# Patient Record
Sex: Male | Born: 1995 | Race: Black or African American | Hispanic: No | Marital: Married | Smoking: Current every day smoker
Health system: Southern US, Community
[De-identification: ages and names within clinical notes are randomized; demographics above are authoritative.]

## PROBLEM LIST (undated history)

## (undated) HISTORY — PX: ANKLE SURGERY: SHX546

## (undated) HISTORY — PX: FOOT SURGERY: SHX648

---
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
## 2014-01-19 ENCOUNTER — Emergency Department (HOSPITAL_COMMUNITY)
Admission: EM | Admit: 2014-01-19 | Discharge: 2014-01-20 | Disposition: A | Discharge status: Home / Self Care | Payer: Self-pay | Attending: Emergency Medicine | Admitting: Emergency Medicine

## 2014-01-19 ENCOUNTER — Encounter (HOSPITAL_COMMUNITY): Payer: Self-pay | Admitting: Emergency Medicine

## 2014-01-19 ENCOUNTER — Emergency Department (HOSPITAL_COMMUNITY): Payer: Self-pay

## 2014-01-19 DIAGNOSIS — N451 Epididymitis: Secondary | ICD-10-CM | POA: Insufficient documentation

## 2014-01-19 DIAGNOSIS — R52 Pain, unspecified: Secondary | ICD-10-CM

## 2014-01-19 DIAGNOSIS — Z791 Long term (current) use of non-steroidal anti-inflammatories (NSAID): Secondary | ICD-10-CM | POA: Insufficient documentation

## 2014-01-19 DIAGNOSIS — Z792 Long term (current) use of antibiotics: Secondary | ICD-10-CM | POA: Insufficient documentation

## 2014-01-19 DIAGNOSIS — N50819 Testicular pain, unspecified: Secondary | ICD-10-CM

## 2014-01-19 LAB — COMPREHENSIVE METABOLIC PANEL
ALBUMIN: 4.3 g/dL (ref 3.5–5.2)
ALT: 10 U/L (ref 0–53)
AST: 21 U/L (ref 0–37)
Alkaline Phosphatase: 73 U/L (ref 39–117)
Anion gap: 14 (ref 5–15)
BUN: 10 mg/dL (ref 6–23)
CALCIUM: 9.9 mg/dL (ref 8.4–10.5)
CHLORIDE: 103 meq/L (ref 96–112)
CO2: 25 mEq/L (ref 19–32)
Creatinine, Ser: 0.73 mg/dL (ref 0.50–1.35)
GFR calc Af Amer: >90 mL/min (ref >90)
Glucose, Bld: 98 mg/dL (ref 70–99)
Potassium: 4.4 mEq/L (ref 3.7–5.3)
SODIUM: 142 meq/L (ref 137–147)
Total Bilirubin: 0.3 mg/dL (ref 0.3–1.2)
Total Protein: 8.2 g/dL (ref 6.0–8.3)

## 2014-01-19 LAB — CBC WITH DIFFERENTIAL/PLATELET
Basophils Absolute: 0 10*3/uL (ref 0.0–0.1)
Basophils Relative: 0 % (ref 0–1)
EOS ABS: 0.1 10*3/uL (ref 0.0–0.7)
EOS PCT: 1 % (ref 0–5)
HCT: 41.7 % (ref 39.0–52.0)
Hemoglobin: 14.5 g/dL (ref 13.0–17.0)
LYMPHS ABS: 3.4 10*3/uL (ref 0.7–4.0)
Lymphocytes Relative: 27 % (ref 12–46)
MCH: 29.2 pg (ref 26.0–34.0)
MCHC: 34.8 g/dL (ref 30.0–36.0)
MCV: 83.9 fL (ref 78.0–100.0)
MONO ABS: 1.1 10*3/uL — AB (ref 0.1–1.0)
MONOS PCT: 9 % (ref 3–12)
Neutro Abs: 8 10*3/uL — ABNORMAL HIGH (ref 1.7–7.7)
Neutrophils Relative %: 63 % (ref 43–77)
Platelets: 259 10*3/uL (ref 150–400)
RBC: 4.97 MIL/uL (ref 4.22–5.81)
RDW: 13.6 % (ref 11.5–15.5)
WBC: 12.6 10*3/uL — ABNORMAL HIGH (ref 4.0–10.5)

## 2014-01-19 NOTE — ED Notes (Signed)
Pt. reports right testicle pain/swelling onset 2 days ago , denies injury , no dysuria , no fever or chills.

## 2014-01-20 ENCOUNTER — Emergency Department (HOSPITAL_COMMUNITY): Payer: Self-pay

## 2014-01-20 LAB — URINALYSIS, ROUTINE W REFLEX MICROSCOPIC
BILIRUBIN URINE: NEGATIVE
GLUCOSE, UA: NEGATIVE mg/dL
Hgb urine dipstick: NEGATIVE
KETONES UR: 15 mg/dL — AB
Nitrite: NEGATIVE
PROTEIN: 30 mg/dL — AB
Specific Gravity, Urine: 1.03 (ref 1.005–1.030)
UROBILINOGEN UA: 1 mg/dL (ref 0.0–1.0)
pH: 6.5 (ref 5.0–8.0)

## 2014-01-20 LAB — URINE MICROSCOPIC-ADD ON

## 2014-01-20 MED ORDER — DEXTROSE 5 % IV SOLN
1.0000 g | Freq: Once | INTRAVENOUS | Status: AC
  Administered 2014-01-20: 1 g via INTRAVENOUS
  Filled 2014-01-20: qty 10

## 2014-01-20 MED ORDER — DOXYCYCLINE HYCLATE 100 MG PO CAPS: 100.0000 mg | ORAL_CAPSULE | Freq: Two times a day (BID) | ORAL | Status: AC

## 2014-01-20 MED ORDER — DOXYCYCLINE HYCLATE 100 MG PO TABS
100.0000 mg | ORAL_TABLET | Freq: Once | ORAL | Status: AC
  Administered 2014-01-20: 100 mg via ORAL
  Filled 2014-01-20: qty 1

## 2014-01-20 MED ORDER — HYDROCODONE-ACETAMINOPHEN 5-325 MG PO TABS
1.0000 | ORAL_TABLET | ORAL | Status: AC
  Administered 2014-01-20: 1 via ORAL
  Filled 2014-01-20: qty 1

## 2014-01-20 MED ORDER — HYDROCODONE-ACETAMINOPHEN 5-325 MG PO TABS: 1.0000 | ORAL_TABLET | ORAL | Status: AC | PRN

## 2014-01-20 NOTE — ED Provider Notes (Signed)
CSN: [id number]     Arrival date & time 01/19/14  2212 History   First MD Initiated Contact with Patient 01/20/14 0001     This chart was scribed for Linwood DibblesJon Kemberly Taves, MD by Arlan OrganAshley Leger, ED Scribe. This patient was seen in room A13C/A13C and the patient's care was started 3:27 AM.;   Chief Complaint  Patient presents with  . Testicle Pain    HPI  HPI Comments: Donald Wallace is a 18 y.o. male who presents to the Emergency Department complaining of sudden onset, constant R sided testicular pain that radiates down the R thigh x 2 days. Pain is exacerbated when bending over without any alleviating factors. Pt also reports mild swelling to the testicle. No recent injury or trauma. He has tried OTC Ibuprofen without any improvement for symptoms. No history of previous similar pain. Pt denies any fever, chills, SOB, CP, or abdominal pain. No known allergies to medications.   History reviewed. No pertinent past medical history. Past Surgical History  Procedure Laterality Date  . Ankle surgery    . Foot surgery     No family history on file. History  Substance Use Topics  . Smoking status: Never Smoker   . Smokeless tobacco: Not on file  . Alcohol Use: No    Review of Systems  Constitutional: Negative for fever and chills.  Respiratory: Negative for shortness of breath.   Cardiovascular: Negative for chest pain.  Gastrointestinal: Negative for nausea and vomiting.  Genitourinary: Positive for testicular pain.  All other systems reviewed and are negative.     Allergies  Review of patient's allergies indicates no known allergies.  Home Medications   Prior to Admission medications   Medication Sig Start Date End Date Taking? Authorizing Provider  clindamycin (CLEOCIN) 300 MG capsule Take 1 capsule (300 mg total) by mouth 3 (three) times daily. 11/21/12   Linna HoffJames D Kindl, MD  diclofenac (CATAFLAM) 50 MG tablet Take 1 tablet (50 mg total) by mouth 3 (three) times daily. 11/21/12   Linna HoffJames D  Kindl, MD  doxycycline (VIBRAMYCIN) 100 MG capsule Take 1 capsule (100 mg total) by mouth 2 (two) times daily. 01/20/14   Linwood DibblesJon Zaquan Duffner, MD  HYDROcodone-acetaminophen (NORCO/VICODIN) 5-325 MG per tablet Take 1-2 tablets by mouth every 4 (four) hours as needed. 01/20/14   Linwood DibblesJon Pookela Sellin, MD  ibuprofen (ADVIL,MOTRIN) 600 MG tablet Take 1 tablet (600 mg total) by mouth every 8 (eight) hours as needed for pain. 11/21/12   Simmie DaviesAngela M Muller, NP   Triage Vitals: BP 137/75 mmHg  Pulse 65  Temp(Src) 98.1 F (36.7 C) (Oral)  Resp 16  Ht 5\' 7"  (1.702 m)  Wt 143 lb (64.864 kg)  BMI 22.39 kg/m2  SpO2 99%   Physical Exam  Constitutional: He appears well-developed and well-nourished. No distress.  HENT:  Head: Normocephalic and atraumatic.  Right Ear: External ear normal.  Left Ear: External ear normal.  Eyes: Conjunctivae are normal. Right eye exhibits no discharge. Left eye exhibits no discharge. No scleral icterus.  Neck: Neck supple. No tracheal deviation present.  Cardiovascular: Normal rate, regular rhythm and intact distal pulses.   Pulmonary/Chest: Effort normal and breath sounds normal. No stridor. No respiratory distress. He has no wheezes. He has no rales.  Abdominal: Soft. Bowel sounds are normal. He exhibits no distension. There is no tenderness. There is no rebound and no guarding. Hernia confirmed negative in the right inguinal area and confirmed negative in the left inguinal area.  Genitourinary: Right  testis shows swelling and tenderness. Left testis shows no swelling and no tenderness.  Musculoskeletal: He exhibits no edema or tenderness.  Neurological: He is alert. He has normal strength. No cranial nerve deficit (no facial droop, extraocular movements intact, no slurred speech) or sensory deficit. He exhibits normal muscle tone. He displays no seizure activity. Coordination normal.  Skin: Skin is warm and dry. No rash noted.  Psychiatric: He has a normal mood and affect.  Nursing note and  vitals reviewed.   ED Course  Procedures (including critical care time)  DIAGNOSTIC STUDIES: Oxygen Saturation is 100% on RA, Normal by my interpretation.    COORDINATION OF CARE: 3:27 AM- Will order blood work, imaging, and urinalysis. Discussed treatment plan with pt at bedside and pt agreed to plan.     Labs Review Labs Reviewed  CBC WITH DIFFERENTIAL - Abnormal; Notable for the following:    WBC 12.6 (*)    Neutro Abs 8.0 (*)    Monocytes Absolute 1.1 (*)    All other components within normal limits  URINALYSIS, ROUTINE W REFLEX MICROSCOPIC - Abnormal; Notable for the following:    Color, Urine AMBER (*)    APPearance CLOUDY (*)    Ketones, ur 15 (*)    Protein, ur 30 (*)    Leukocytes, UA MODERATE (*)    All other components within normal limits  URINE MICROSCOPIC-ADD ON - Abnormal; Notable for the following:    Squamous Epithelial / LPF FEW (*)    Bacteria, UA FEW (*)    All other components within normal limits  COMPREHENSIVE METABOLIC PANEL    Imaging Review [place] Scrotum  01/20/2014   CLINICAL DATA:  Acute right testicular pain.  EXAM: SCROTAL ULTRASOUND  DOPPLER ULTRASOUND OF THE TESTICLES  TECHNIQUE: Complete ultrasound examination of the testicles, epididymis, and other scrotal structures was performed. Color and spectral Doppler ultrasound were also utilized to evaluate blood flow to the testicles.  COMPARISON:  None.  FINDINGS: Right testicle  Measurements: 3.9 x 3.6 x 2.5 cm. Increased flow is noted on Doppler. No mass or microlithiasis visualized.  Left testicle  Measurements: 4.6 x 3.0 x 2.1 cm. No mass or microlithiasis visualized.  Right epididymis: Right epididymis is significantly enlarged with increased flow seen on Doppler.  Left epididymis:  Normal in size and appearance.  Hydrocele:  None visualized.  Varicocele:  Mild bilateral varicoceles are noted.  Pulsed Doppler interrogation of both testes demonstrates low resistance arterial and venous waveforms  bilaterally.  IMPRESSION: Findings consistent with right epididymitis with possible associated right orchitis.   Electronically Signed   By: Roque LiasJames  Green M.D.   On: 01/20/2014 01:59   [place] Art/ven Flow Abd Pelv Doppler  01/20/2014   CLINICAL DATA:  Acute right testicular pain.  EXAM: SCROTAL ULTRASOUND  DOPPLER ULTRASOUND OF THE TESTICLES  TECHNIQUE: Complete ultrasound examination of the testicles, epididymis, and other scrotal structures was performed. Color and spectral Doppler ultrasound were also utilized to evaluate blood flow to the testicles.  COMPARISON:  None.  FINDINGS: Right testicle  Measurements: 3.9 x 3.6 x 2.5 cm. Increased flow is noted on Doppler. No mass or microlithiasis visualized.  Left testicle  Measurements: 4.6 x 3.0 x 2.1 cm. No mass or microlithiasis visualized.  Right epididymis: Right epididymis is significantly enlarged with increased flow seen on Doppler.  Left epididymis:  Normal in size and appearance.  Hydrocele:  None visualized.  Varicocele:  Mild bilateral varicoceles are noted.  Pulsed Doppler interrogation of both  testes demonstrates low resistance arterial and venous waveforms bilaterally.  IMPRESSION: Findings consistent with right epididymitis with possible associated right orchitis.   Electronically Signed   By: Roque Lias M.D.   On: 01/20/2014 01:59      MDM   Final diagnoses:  Epididymitis    Patient's ultrasound is consistent with his exam findings. He has an epididymitis and possible right orchitis. No evidence of testicular torsion. The patient will be treated with a gram of Rocephin IV in the emergency department and a prescription for doxycycline. Follow up with urology if not improving.  I personally performed the services described in this documentation, which was scribed in my presence. The recorded information has been reviewed and is accurate.    Linwood Dibbles, MD 01/20/14 [phone]

## 2014-01-20 NOTE — Discharge Instructions (Signed)
Epididymitis °Epididymitis is a swelling (inflammation) of the epididymis. The epididymis is a cord-like structure along the back part of the testicle. Epididymitis is usually, but not always, caused by infection. This is usually a sudden problem beginning with chills, fever and pain behind the scrotum and in the testicle. There may be swelling and redness of the testicle. °DIAGNOSIS  °Physical examination will reveal a tender, swollen epididymis. Sometimes, cultures are obtained from the urine or from prostate secretions to help find out if there is an infection or if the cause is a different problem. Sometimes, blood work is performed to see if your white blood cell count is elevated and if a germ (bacterial) or viral infection is present. Using this knowledge, an appropriate medicine which kills germs (antibiotic) can be chosen by your caregiver. A viral infection causing epididymitis will most often go away (resolve) without treatment. °HOME CARE INSTRUCTIONS  °· Hot sitz baths for 20 minutes, 4 times per day, may help relieve pain. °· Only take over-the-counter or prescription medicines for pain, discomfort or fever as directed by your caregiver. °· Take all medicines, including antibiotics, as directed. Take the antibiotics for the full prescribed length of time even if you are feeling better. °· It is very important to keep all follow-up appointments. °SEEK IMMEDIATE MEDICAL CARE IF:  °· You have a fever. °· You have pain not relieved with medicines. °· You have any worsening of your problems. °· Your pain seems to come and go. °· You develop pain, redness, and swelling in the scrotum and surrounding areas. °MAKE SURE YOU:  °· Understand these instructions. °· Will watch your condition. °· Will get help right away if you are not doing well or get worse. °Document Released: 01/21/2000 Document Revised: 04/17/2011 Document Reviewed: 12/10/2008 °ExitCare® Patient Information ©2015 [employer]. This information  is not intended to replace advice given to you by your health care provider. Make sure you discuss any questions you have with your health care provider. ° °

## 2014-10-12 ENCOUNTER — Encounter (HOSPITAL_COMMUNITY): Payer: Self-pay | Admitting: Emergency Medicine

## 2014-10-12 ENCOUNTER — Emergency Department (HOSPITAL_COMMUNITY)
Admission: EM | Admit: 2014-10-12 | Discharge: 2014-10-13 | Disposition: A | Discharge status: Home / Self Care | Payer: Self-pay | Attending: Emergency Medicine | Admitting: Emergency Medicine

## 2014-10-12 DIAGNOSIS — Z791 Long term (current) use of non-steroidal anti-inflammatories (NSAID): Secondary | ICD-10-CM | POA: Insufficient documentation

## 2014-10-12 DIAGNOSIS — Z792 Long term (current) use of antibiotics: Secondary | ICD-10-CM | POA: Insufficient documentation

## 2014-10-12 DIAGNOSIS — J029 Acute pharyngitis, unspecified: Secondary | ICD-10-CM | POA: Insufficient documentation

## 2014-10-12 LAB — RAPID STREP SCREEN (MED CTR MEBANE ONLY): Streptococcus, Group A Screen (Direct): NEGATIVE

## 2014-10-12 MED ORDER — IBUPROFEN 200 MG PO TABS
600.0000 mg | ORAL_TABLET | Freq: Once | ORAL | Status: AC
  Administered 2014-10-13: 600 mg via ORAL
  Filled 2014-10-12: qty 3

## 2014-10-12 NOTE — ED Notes (Signed)
Pt reports difficulty swallowing and "I can't really breathe" starting today. Denies being sick recently. When asked if throat was sore says, "I don't really know." RR even/unlabored. No other c/c.

## 2014-10-12 NOTE — ED Provider Notes (Signed)
CSN: [id number]     Arrival date & time 10/12/14  2321 History   First MD Initiated Contact with Patient 10/12/14 2331     Chief Complaint  Patient presents with  . Sore Throat  . Nasal Congestion     (Consider location/radiation/quality/duration/timing/severity/associated sxs/prior Treatment) Patient is a 19 y.o. male presenting with pharyngitis. The history is provided by the patient. No language interpreter was used.  Sore Throat This is a new problem. The current episode started today. Associated symptoms include a sore throat. Pertinent negatives include no chills or fever. Associated symptoms comments: Sore throat since this morning. No fever. He has not taken anything for symptoms. He reports it hurts to swallow and also that his neck is sore in the front and around to the back. No cough or significant congestion.Marland Kitchen    History reviewed. No pertinent past medical history. Past Surgical History  Procedure Laterality Date  . Ankle surgery    . Foot surgery     History reviewed. No pertinent family history. Social History  Substance Use Topics  . Smoking status: Never Smoker   . Smokeless tobacco: None  . Alcohol Use: No    Review of Systems  Constitutional: Negative for fever and chills.  HENT: Positive for sore throat. Negative for trouble swallowing.   Respiratory: Negative.   Cardiovascular: Negative.   Gastrointestinal: Negative.   Musculoskeletal: Negative.   Skin: Negative.   Neurological: Negative.       Allergies  Review of patient's allergies indicates no known allergies.  Home Medications   Prior to Admission medications   Medication Sig Start Date End Date Taking? Authorizing Provider  clindamycin (CLEOCIN) 300 MG capsule Take 1 capsule (300 mg total) by mouth 3 (three) times daily. 11/21/12   Linna Hoff, MD  diclofenac (CATAFLAM) 50 MG tablet Take 1 tablet (50 mg total) by mouth 3 (three) times daily. 11/21/12   Linna Hoff, MD  doxycycline  (VIBRAMYCIN) 100 MG capsule Take 1 capsule (100 mg total) by mouth 2 (two) times daily. 01/20/14   Linwood Dibbles, MD  HYDROcodone-acetaminophen (NORCO/VICODIN) 5-325 MG per tablet Take 1-2 tablets by mouth every 4 (four) hours as needed. 01/20/14   Linwood Dibbles, MD  ibuprofen (ADVIL,MOTRIN) 600 MG tablet Take 1 tablet (600 mg total) by mouth every 8 (eight) hours as needed for pain. 11/21/12   Isaias Sakai, NP   BP 132/77 mmHg  Pulse 71  Temp(Src) 97.9 F (36.6 C) (Oral)  Resp 17  SpO2 97% Physical Exam  Constitutional: He is oriented to person, place, and time. He appears well-developed and well-nourished. No distress.  HENT:  Oropharynx slightly erythematous without swelling or exudates. Uvula midline.   Neck: Normal range of motion. Neck supple.  Cardiovascular: Normal rate.   Pulmonary/Chest: Effort normal.  Abdominal: Soft. There is no tenderness.  Musculoskeletal: Normal range of motion.  Lymphadenopathy:    He has cervical adenopathy.  Neurological: He is alert and oriented to person, place, and time.  Skin: Skin is warm and dry.  Psychiatric: He has a normal mood and affect.    ED Course  Procedures (including critical care time) Labs Review Labs Reviewed  RAPID STREP SCREEN (NOT AT [facility])  CULTURE, GROUP A STREP    Imaging Review No results found. I have personally reviewed and evaluated these images and lab results as part of my medical decision-making.   EKG Interpretation None      MDM   Final diagnoses:  None  1. Pharyngitis  Patient is well appearing with normal VS. Negative strep. Suspect viral process requiring supportive care.     Elpidio Anis, PA-C 10/13/14 4098  Tomasita Crumble, MD 10/13/14 [phone]

## 2014-10-13 MED ORDER — IBUPROFEN 600 MG PO TABS: 600.0000 mg | ORAL_TABLET | Freq: Four times a day (QID) | ORAL | Status: DC | PRN

## 2014-10-13 NOTE — Discharge Instructions (Signed)
Salt Water Gargle °This solution will help make your mouth and throat feel better. °HOME CARE INSTRUCTIONS  °· Mix 1 teaspoon of salt in 8 ounces of warm water. °· Gargle with this solution as much or often as you need or as directed. Swish and gargle gently if you have any sores or wounds in your mouth. °· Do not swallow this mixture. °Document Released: 10/28/2003 Document Revised: 04/17/2011 Document Reviewed: 03/20/2008 °ExitCare® Patient Information ©2015 [employer]. This information is not intended to replace advice given to you by your health care provider. Make sure you discuss any questions you have with your health care provider. °Pharyngitis °Pharyngitis is redness, pain, and swelling (inflammation) of your pharynx.  °CAUSES  °Pharyngitis is usually caused by infection. Most of the time, these infections are from viruses (viral) and are part of a cold. However, sometimes pharyngitis is caused by bacteria (bacterial). Pharyngitis can also be caused by allergies. Viral pharyngitis may be spread from person to person by coughing, sneezing, and personal items or utensils (cups, forks, spoons, toothbrushes). Bacterial pharyngitis may be spread from person to person by more intimate contact, such as kissing.  °SIGNS AND SYMPTOMS  °Symptoms of pharyngitis include:   °· Sore throat.   °· Tiredness (fatigue).   °· Low-grade fever.   °· Headache. °· Joint pain and muscle aches. °· Skin rashes. °· Swollen lymph nodes. °· Plaque-like film on throat or tonsils (often seen with bacterial pharyngitis). °DIAGNOSIS  °Your health care provider will ask you questions about your illness and your symptoms. Your medical history, along with a physical exam, is often all that is needed to diagnose pharyngitis. Sometimes, a rapid strep test is done. Other lab tests may also be done, depending on the suspected cause.  °TREATMENT  °Viral pharyngitis will usually get better in 3-4 days without the use of medicine. Bacterial  pharyngitis is treated with medicines that kill germs (antibiotics).  °HOME CARE INSTRUCTIONS  °· Drink enough water and fluids to keep your urine clear or pale yellow.   °· Only take over-the-counter or prescription medicines as directed by your health care provider:   °¨ If you are prescribed antibiotics, make sure you finish them even if you start to feel better.   °¨ Do not take aspirin.   °· Get lots of rest.   °· Gargle with 8 oz of salt water (½ tsp of salt per 1 qt of water) as often as every 1-2 hours to soothe your throat.   °· Throat lozenges (if you are not at risk for choking) or sprays may be used to soothe your throat. °SEEK MEDICAL CARE IF:  °· You have large, tender lumps in your neck. °· You have a rash. °· You cough up green, yellow-brown, or bloody spit. °SEEK IMMEDIATE MEDICAL CARE IF:  °· Your neck becomes stiff. °· You drool or are unable to swallow liquids. °· You vomit or are unable to keep medicines or liquids down. °· You have severe pain that does not go away with the use of recommended medicines. °· You have trouble breathing (not caused by a stuffy nose). °MAKE SURE YOU:  °· Understand these instructions. °· Will watch your condition. °· Will get help right away if you are not doing well or get worse. °Document Released: 01/23/2005 Document Revised: 11/13/2012 Document Reviewed: 09/30/2012 °ExitCare® Patient Information ©2015 [employer]. This information is not intended to replace advice given to you by your health care provider. Make sure you discuss any questions you have with your health care provider. ° °

## 2014-10-15 LAB — CULTURE, GROUP A STREP: Strep A Culture: NEGATIVE

## 2015-10-16 ENCOUNTER — Emergency Department (HOSPITAL_COMMUNITY): Admission: EM | Admit: 2015-10-16 | Discharge: 2015-10-17 | Discharge status: Against Medical Advice | Payer: Self-pay

## 2015-10-16 NOTE — ED Notes (Signed)
Pt called multiple times with no answer.

## 2016-07-26 ENCOUNTER — Emergency Department (HOSPITAL_COMMUNITY)
Admission: EM | Admit: 2016-07-26 | Discharge: 2016-07-26 | Disposition: A | Discharge status: Home / Self Care | Payer: Self-pay | Attending: Emergency Medicine | Admitting: Emergency Medicine

## 2016-07-26 ENCOUNTER — Encounter (HOSPITAL_COMMUNITY): Payer: Self-pay

## 2016-07-26 ENCOUNTER — Ambulatory Visit: Payer: Self-pay | Admitting: Internal Medicine

## 2016-07-26 DIAGNOSIS — F17200 Nicotine dependence, unspecified, uncomplicated: Secondary | ICD-10-CM | POA: Insufficient documentation

## 2016-07-26 DIAGNOSIS — K029 Dental caries, unspecified: Secondary | ICD-10-CM | POA: Insufficient documentation

## 2016-07-26 MED ORDER — AMOXICILLIN 500 MG PO CAPS: 500.0000 mg | ORAL_CAPSULE | Freq: Three times a day (TID) | ORAL | 0 refills | Status: AC

## 2016-07-26 MED ORDER — IBUPROFEN 600 MG PO TABS: 600.0000 mg | ORAL_TABLET | Freq: Four times a day (QID) | ORAL | 0 refills | Status: AC | PRN

## 2016-07-26 MED ORDER — PENICILLIN V POTASSIUM 500 MG PO TABS: 500.0000 mg | ORAL_TABLET | Freq: Three times a day (TID) | ORAL | 0 refills | Status: AC

## 2016-07-26 MED ORDER — IBUPROFEN 800 MG PO TABS
800.0000 mg | ORAL_TABLET | Freq: Once | ORAL | Status: AC
  Administered 2016-07-26: 800 mg via ORAL

## 2016-07-26 NOTE — ED Notes (Signed)
EDP at bedside  

## 2016-07-26 NOTE — ED Provider Notes (Signed)
[facility]-EMERGENCY DEPT Provider Note   CSN: [id number] Arrival date & time: 07/26/16  1305  By signing my name below, I, Cynda AcresHailei Fulton, attest that this documentation has been prepared under the direction and in the presence of Fayrene HelperBowie Monnie Gudgel, PA-C. Electronically Signed: Cynda AcresHailei Fulton, Scribe. 07/26/16. 3:05 PM.  History   Chief Complaint Chief Complaint  Patient presents with  . Dental Pain    HPI Comments: Donald Wallace is a 21 y.o. male with no pertinent past medical history, who presents to the Emergency Department complaining of sudden onset, constant left lower dental pain that began four days ago. Patient states he has had gradually worsening pain for the past few days. Patient describes his pain as severe throbbing, sharp, stabbing, and dull. Patient has not followed up with a dentist. Patient reports taking hydrocodone and tylenol with no relief. Patient denies any discharge, numbness, tingling, weakness, fever, chills, nausea, or vomiting.   The history is provided by the patient. No language interpreter was used.    History reviewed. No pertinent past medical history.  There are no active problems to display for this patient.   Past Surgical History:  Procedure Laterality Date  . ANKLE SURGERY    . FOOT SURGERY         Home Medications    Prior to Admission medications   Medication Sig Start Date End Date Taking? Authorizing Provider  clindamycin (CLEOCIN) 300 MG capsule Take 1 capsule (300 mg total) by mouth 3 (three) times daily. 11/21/12   Linna HoffKindl, Johari D, MD  diclofenac (CATAFLAM) 50 MG tablet Take 1 tablet (50 mg total) by mouth 3 (three) times daily. 11/21/12   Linna HoffKindl, Erwin D, MD  doxycycline (VIBRAMYCIN) 100 MG capsule Take 1 capsule (100 mg total) by mouth 2 (two) times daily. 01/20/14   Linwood DibblesKnapp, Jon, MD  HYDROcodone-acetaminophen (NORCO/VICODIN) 5-325 MG per tablet Take 1-2 tablets by mouth every 4 (four) hours as needed. 01/20/14   Linwood DibblesKnapp, Jon, MD  ibuprofen  (ADVIL,MOTRIN) 600 MG tablet Take 1 tablet (600 mg total) by mouth every 6 (six) hours as needed. 10/13/14   Elpidio AnisUpstill, Shari, PA-C    Family History No family history on file.  Social History Social History  Substance Use Topics  . Smoking status: Current Every Day Smoker  . Smokeless tobacco: Current User  . Alcohol use No     Allergies   Patient has no known allergies.   Review of Systems Review of Systems  Constitutional: Negative for chills and fever.  HENT: Positive for dental problem. Negative for facial swelling.   Gastrointestinal: Negative for nausea and vomiting.  Neurological: Negative for weakness and numbness.     Physical Exam Updated Vital Signs BP (!) 155/88 (BP Location: Right Arm)   Pulse 89   Temp 98.5 F (36.9 C) (Oral)   Resp 20   SpO2 98%   Physical Exam  Constitutional: He is oriented to person, place, and time. He appears well-developed.  HENT:  Head: Normocephalic and atraumatic.  Mouth/Throat: Oropharynx is clear and moist.  Significant dental decay to tooth numbers 17, 18, and 19. Tenderness to tooth number 18, no gingival erythema, abscess, or trismus. No lymph adenopathy.   Eyes: Conjunctivae and EOM are normal. Pupils are equal, round, and reactive to light.  Neck: Normal range of motion. Neck supple.  Cardiovascular: Normal rate.   Pulmonary/Chest: Effort normal.  Abdominal: Soft. Bowel sounds are normal.  Musculoskeletal: Normal range of motion.  Lymphadenopathy:    He has  no cervical adenopathy.  Neurological: He is alert and oriented to person, place, and time.  Skin: Skin is warm and dry.  Psychiatric: He has a normal mood and affect.  Nursing note and vitals reviewed.    ED Treatments / Results  DIAGNOSTIC STUDIES: Oxygen Saturation is 98% on RA, normal by my interpretation.    COORDINATION OF CARE: 3:05 PM Discussed treatment plan with pt at bedside and pt agreed to plan, which includes antibiotics and ibuprofen.    Labs (all labs ordered are listed, but only abnormal results are displayed) Labs Reviewed - No data to display  EKG  EKG Interpretation None       Radiology No results found.  Procedures Procedures (including critical care time)  Medications Ordered in ED Medications - No data to display   Initial Impression / Assessment and Plan / ED Course  I have reviewed the triage vital signs and the nursing notes.  Pertinent labs & imaging results that were available during my care of the patient were reviewed by me and considered in my medical decision making (see chart for details).     BP (!) 155/88 (BP Location: Right Arm)   Pulse 89   Temp 98.5 F (36.9 C) (Oral)   Resp 20   SpO2 98%    Final Clinical Impressions(s) / ED Diagnoses   Final diagnoses:  Pain due to dental caries    New Prescriptions Discharge Medication List as of 07/26/2016  3:09 PM    START taking these medications   Details  penicillin v potassium (VEETID) 500 MG tablet Take 1 tablet (500 mg total) by mouth 3 (three) times daily., Starting Wed 07/26/2016, Print       I personally performed the services described in this documentation, which was scribed in my presence. The recorded information has been reviewed and is accurate.      Fayrene Helper, PA-C 07/26/16 1614    Mancel Bale, MD 07/26/16 [phone]

## 2016-07-26 NOTE — Discharge Instructions (Signed)
Please call and follow up with dentist today or tomorrow for further care.

## 2016-07-26 NOTE — ED Triage Notes (Signed)
Pt reports left lower back dental pain for three days
